# Patient Record
Sex: Male | Born: 1970 | Race: White | Hispanic: No | Marital: Married | Smoking: Never smoker
Health system: Southern US, Community
[De-identification: ages and names within clinical notes are randomized; demographics above are authoritative.]

## PROBLEM LIST (undated history)

## (undated) DIAGNOSIS — K219 Gastro-esophageal reflux disease without esophagitis: Secondary | ICD-10-CM

## (undated) DIAGNOSIS — I10 Essential (primary) hypertension: Secondary | ICD-10-CM

## (undated) HISTORY — PX: SALIVARY GLAND SURGERY: SHX768

## (undated) HISTORY — PX: CHOLECYSTECTOMY: SHX55

## (undated) HISTORY — DX: Gastro-esophageal reflux disease without esophagitis: K21.9

---

## 1998-11-30 ENCOUNTER — Emergency Department (HOSPITAL_COMMUNITY): Admission: EM | Admit: 1998-11-30 | Discharge: 1998-11-30 | Payer: Self-pay | Admitting: Emergency Medicine

## 1998-12-29 ENCOUNTER — Ambulatory Visit (HOSPITAL_COMMUNITY): Admission: RE | Admit: 1998-12-29 | Discharge: 1998-12-29 | Payer: Self-pay | Admitting: Specialist

## 1998-12-29 ENCOUNTER — Encounter: Payer: Self-pay | Admitting: Specialist

## 1999-05-22 ENCOUNTER — Emergency Department (HOSPITAL_COMMUNITY): Admission: EM | Admit: 1999-05-22 | Discharge: 1999-05-22 | Payer: Self-pay | Admitting: Emergency Medicine

## 1999-05-22 ENCOUNTER — Encounter: Payer: Self-pay | Admitting: Emergency Medicine

## 2000-07-29 HISTORY — PX: FOOT SURGERY: SHX648

## 2000-08-08 ENCOUNTER — Encounter: Payer: Self-pay | Admitting: Emergency Medicine

## 2000-08-08 ENCOUNTER — Emergency Department (HOSPITAL_COMMUNITY): Admission: EM | Admit: 2000-08-08 | Discharge: 2000-08-08 | Payer: Self-pay | Admitting: Emergency Medicine

## 2001-09-28 ENCOUNTER — Emergency Department (HOSPITAL_COMMUNITY): Admission: EM | Admit: 2001-09-28 | Discharge: 2001-09-29 | Payer: Self-pay | Admitting: Emergency Medicine

## 2001-12-11 ENCOUNTER — Emergency Department (HOSPITAL_COMMUNITY): Admission: EM | Admit: 2001-12-11 | Discharge: 2001-12-11 | Payer: Self-pay | Admitting: *Deleted

## 2001-12-11 ENCOUNTER — Encounter: Payer: Self-pay | Admitting: Emergency Medicine

## 2002-02-01 ENCOUNTER — Encounter: Admission: RE | Admit: 2002-02-01 | Discharge: 2002-03-11 | Payer: Self-pay | Admitting: Orthopedic Surgery

## 2002-07-22 ENCOUNTER — Emergency Department (HOSPITAL_COMMUNITY): Admission: EM | Admit: 2002-07-22 | Discharge: 2002-07-22 | Payer: Self-pay | Admitting: *Deleted

## 2009-07-12 ENCOUNTER — Emergency Department (HOSPITAL_COMMUNITY): Admission: EM | Admit: 2009-07-12 | Discharge: 2009-07-12 | Payer: Self-pay | Admitting: Emergency Medicine

## 2010-10-30 LAB — URINALYSIS, ROUTINE W REFLEX MICROSCOPIC
Bilirubin Urine: NEGATIVE
Glucose, UA: NEGATIVE mg/dL
Hgb urine dipstick: NEGATIVE
Ketones, ur: NEGATIVE mg/dL
Nitrite: NEGATIVE
Protein, ur: NEGATIVE mg/dL
Specific Gravity, Urine: 1.015 (ref 1.005–1.030)
Urobilinogen, UA: 0.2 mg/dL (ref 0.0–1.0)
pH: 7 (ref 5.0–8.0)

## 2010-10-30 LAB — CBC
MCHC: 34.4 g/dL (ref 30.0–36.0)
RBC: 5.37 MIL/uL (ref 4.22–5.81)
WBC: 10.4 10*3/uL (ref 4.0–10.5)

## 2010-10-30 LAB — DIFFERENTIAL
Basophils Relative: 1 % (ref 0–1)
Lymphocytes Relative: 24 % (ref 12–46)
Lymphs Abs: 2.5 10*3/uL (ref 0.7–4.0)
Monocytes Relative: 8 % (ref 3–12)
Neutro Abs: 6.6 10*3/uL (ref 1.7–7.7)
Neutrophils Relative %: 63 % (ref 43–77)

## 2010-10-30 LAB — BASIC METABOLIC PANEL
Calcium: 9 mg/dL (ref 8.4–10.5)
Creatinine, Ser: 0.88 mg/dL (ref 0.4–1.5)
GFR calc Af Amer: >60 mL/min (ref >60)
GFR calc non Af Amer: >60 mL/min (ref >60)

## 2011-09-10 ENCOUNTER — Emergency Department (HOSPITAL_COMMUNITY)
Admission: EM | Admit: 2011-09-10 | Discharge: 2011-09-10 | Disposition: A | Discharge status: Home / Self Care | Payer: 59 | Attending: Emergency Medicine | Admitting: Emergency Medicine

## 2011-09-10 ENCOUNTER — Emergency Department (HOSPITAL_COMMUNITY): Payer: 59

## 2011-09-10 ENCOUNTER — Encounter (HOSPITAL_COMMUNITY): Payer: Self-pay

## 2011-09-10 DIAGNOSIS — R221 Localized swelling, mass and lump, neck: Secondary | ICD-10-CM | POA: Insufficient documentation

## 2011-09-10 DIAGNOSIS — R220 Localized swelling, mass and lump, head: Secondary | ICD-10-CM | POA: Insufficient documentation

## 2011-09-10 DIAGNOSIS — M549 Dorsalgia, unspecified: Secondary | ICD-10-CM | POA: Insufficient documentation

## 2011-09-10 DIAGNOSIS — R6884 Jaw pain: Secondary | ICD-10-CM | POA: Insufficient documentation

## 2011-09-10 DIAGNOSIS — Z79899 Other long term (current) drug therapy: Secondary | ICD-10-CM | POA: Insufficient documentation

## 2011-09-10 DIAGNOSIS — C07 Malignant neoplasm of parotid gland: Secondary | ICD-10-CM

## 2011-09-10 DIAGNOSIS — R55 Syncope and collapse: Secondary | ICD-10-CM | POA: Insufficient documentation

## 2011-09-10 DIAGNOSIS — I10 Essential (primary) hypertension: Secondary | ICD-10-CM | POA: Insufficient documentation

## 2011-09-10 DIAGNOSIS — R234 Changes in skin texture: Secondary | ICD-10-CM | POA: Insufficient documentation

## 2011-09-10 HISTORY — DX: Essential (primary) hypertension: I10

## 2011-09-10 LAB — BASIC METABOLIC PANEL
BUN: 11 mg/dL (ref 6–23)
Chloride: 101 mEq/L (ref 96–112)
Glucose, Bld: 84 mg/dL (ref 70–99)
Potassium: 3.4 mEq/L — ABNORMAL LOW (ref 3.5–5.1)

## 2011-09-10 LAB — DIFFERENTIAL
Eosinophils Absolute: 0.3 10*3/uL (ref 0.0–0.7)
Lymphs Abs: 2.4 10*3/uL (ref 0.7–4.0)
Monocytes Relative: 7 % (ref 3–12)
Neutrophils Relative %: 71 % (ref 43–77)

## 2011-09-10 LAB — CBC
Hemoglobin: 16.2 g/dL (ref 13.0–17.0)
MCH: 29 pg (ref 26.0–34.0)
RBC: 5.59 MIL/uL (ref 4.22–5.81)

## 2011-09-10 MED ORDER — OXYCODONE-ACETAMINOPHEN 5-325 MG PO TABS
1.0000 | ORAL_TABLET | Freq: Once | ORAL | Status: AC
  Administered 2011-09-10: 1 via ORAL
  Filled 2011-09-10: qty 1

## 2011-09-10 MED ORDER — SODIUM CHLORIDE 0.9 % IV SOLN
INTRAVENOUS | Status: DC
  Administered 2011-09-10: 15:00:00 via INTRAVENOUS

## 2011-09-10 MED ORDER — MORPHINE SULFATE 4 MG/ML IJ SOLN
4.0000 mg | Freq: Once | INTRAMUSCULAR | Status: AC
  Administered 2011-09-10: 4 mg via INTRAVENOUS
  Filled 2011-09-10: qty 1

## 2011-09-10 MED ORDER — OXYCODONE-ACETAMINOPHEN 5-325 MG PO TABS: 2.0000 | ORAL_TABLET | ORAL | Status: AC | PRN

## 2011-09-10 MED ORDER — IOHEXOL 300 MG/ML  SOLN
75.0000 mL | Freq: Once | INTRAMUSCULAR | Status: AC | PRN
  Administered 2011-09-10: 75 mL via INTRAVENOUS

## 2011-09-10 NOTE — ED Provider Notes (Signed)
History     CSN: [id number]  Arrival date & time 09/10/11  1137   First MD Initiated Contact with Patient 09/10/11 1256      Chief Complaint  Patient presents with  . Fall  . Near Syncope    (Consider location/radiation/quality/duration/timing/severity/associated sxs/prior treatment) Patient is a 41 y.o. male presenting with fall. The history is provided by the patient and medical records.  Fall Pertinent negatives include no abdominal pain and no headaches.   the patient is a 41 year old, male, who complains of left sided jaw pain and swelling since.  He fell 3 days ago.  He states that he and his wife had gone out to a restaurant to eat.  Afterwards, he developed excruciating abdominal pain.  He went to the bathroom and passed out.  He struck the side of his face.  He has had pain and swelling on the left side of his jaw.  Since then.  His abdominal pain is resolved.  He went to see his primary care doctor.  They did plain x-rays, but were not able to perform a Panorex, so they sent him over here for further x-rays to see if he had a mandibular fracture.  An EKG was also performed at the primary care physician's hospital that did not show any signs of ischemia or irregular heartbeat.  Past Medical History  Diagnosis Date  . Hypertension     History reviewed. No pertinent past surgical history.  History reviewed. No pertinent family history.  History  Substance Use Topics  . Smoking status: Never Smoker   . Smokeless tobacco: Not on file  . Alcohol Use: No      Review of Systems  HENT: Negative for neck pain.   Gastrointestinal: Negative for abdominal pain.  Musculoskeletal: Positive for back pain.       Left mandibular pain and swelling  Neurological: Negative for weakness and headaches.  Hematological: Does not bruise/bleed easily.  Psychiatric/Behavioral: Negative for confusion.  All other systems reviewed and are negative.    Allergies  Allegra  Home  Medications   Current Outpatient Rx  Name Route Sig Dispense Refill  . HYDROCHLOROTHIAZIDE 25 MG PO TABS Oral Take 25 mg by mouth daily.    . TRAMADOL HCL 50 MG PO TABS Oral Take 50 mg by mouth every 6 (six) hours as needed. For pain      BP 174/112  Pulse 75  Temp(Src) 97.9 F (36.6 C) (Oral)  Resp 20  SpO2 95%  Physical Exam  Vitals reviewed. Constitutional: He is oriented to person, place, and time. He appears well-developed and well-nourished.  HENT:  Head: Normocephalic.  Eyes: Conjunctivae are normal. Pupils are equal, round, and reactive to light.  Neck: Normal range of motion.  Cardiovascular: Normal rate.   No murmur heard. Pulmonary/Chest: Effort normal. No respiratory distress.  Abdominal: Soft. He exhibits no distension. There is no tenderness.  Musculoskeletal: Normal range of motion.       Patient has obvious swelling at the angle of the mandible on the left-hand side.  No overlying ecchymoses.  There is tenderness at the angle of the mandible on the left side.  His teeth are properly aligned.  There is no other tenderness in the mandible over the maxilla and there is no other evidence of head injury.  The swollen area is approx. 4 cm indurated and ttp  No thoracic or lumbar spine tenderness  Neurological: He is alert and oriented to person, place, and time.  Skin: Skin is warm and dry.  Psychiatric: He has a normal mood and affect.    ED Course  Procedures (including critical care time) Fall 3 days ago due to what sounds like a vasovagal reaction from severe abdominal pain.  EKG does not show any signs of dysrhythmias.  Left mandibular pain, swelling, and tenderness.  Give him an analgesic, and perform a Panorex of his jaw, to look for fracture.  He also has significant hypertension, so we will do blood tests, to look for any signs of renal insufficiency    Labs Reviewed  CBC  DIFFERENTIAL  BASIC METABOLIC PANEL   No results found.   No diagnosis  found.  Pain improved.  Discussed result of test and other possibilities eg lymph node, parotid.   Suggested ct for better eval. He agrees.   Called radiologist. He suggests ct neck with contrast.  I spoke with Dr. Jearld Fenton, ENT. He says the CT result is the type of problem he takes care of. He says I can refer the pt to his office for definitive care.   I explained findings to pt and wife. They understand and will schedule appt with dr. Jearld Fenton.  MDM  Left parotid gland ca.        Nicholes Stairs, MD 09/10/11 [phone]

## 2011-09-10 NOTE — ED Notes (Signed)
[facility], DR. Hall, referred pt to ER. Pt states that he passed you on Saturday. Pt states that he hit his head and jaw when he fell. Pt noted to have swelling in left jaw. Neuro intact. No noted weakness in extremities.

## 2011-09-10 NOTE — ED Notes (Signed)
Pt states that he is having stomach cramps now. States that this started before his last  episode

## 2011-09-10 NOTE — ED Notes (Signed)
Sent from family md for swelling to right jaw due to fall this weekend and had elevated bp at md office.

## 2011-09-10 NOTE — Discharge Instructions (Signed)
Your x-ray does not show any broken bones.  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  Use Percocet for pain.  Call to schedule an appointment with Dr. Jearld Fenton tomorrow.  Return for worse or uncontrolled symptoms

## 2011-09-10 NOTE — ED Notes (Signed)
Pt transported to and from CT scanner via wheelchair and tolerated well.  Pt reports no relief from headache.

## 2011-12-03 ENCOUNTER — Other Ambulatory Visit: Payer: Self-pay | Admitting: Specialist

## 2011-12-03 DIAGNOSIS — M542 Cervicalgia: Secondary | ICD-10-CM

## 2011-12-04 ENCOUNTER — Ambulatory Visit
Admission: RE | Admit: 2011-12-04 | Discharge: 2011-12-04 | Disposition: A | Discharge status: Home / Self Care | Payer: 59 | Source: Ambulatory Visit | Attending: Specialist | Admitting: Specialist

## 2011-12-04 ENCOUNTER — Other Ambulatory Visit (HOSPITAL_COMMUNITY): Payer: Self-pay | Admitting: Family Medicine

## 2011-12-04 DIAGNOSIS — R55 Syncope and collapse: Secondary | ICD-10-CM

## 2011-12-04 DIAGNOSIS — I10 Essential (primary) hypertension: Secondary | ICD-10-CM

## 2011-12-04 DIAGNOSIS — M542 Cervicalgia: Secondary | ICD-10-CM

## 2011-12-05 ENCOUNTER — Encounter (HOSPITAL_COMMUNITY): Payer: 59

## 2011-12-11 ENCOUNTER — Ambulatory Visit (HOSPITAL_COMMUNITY): Payer: 59 | Attending: Cardiology | Admitting: Radiology

## 2011-12-11 VITALS — BP 119/92 | HR 72 | Ht 70.0 in | Wt 258.0 lb

## 2011-12-11 DIAGNOSIS — R42 Dizziness and giddiness: Secondary | ICD-10-CM | POA: Insufficient documentation

## 2011-12-11 DIAGNOSIS — I10 Essential (primary) hypertension: Secondary | ICD-10-CM | POA: Insufficient documentation

## 2011-12-11 DIAGNOSIS — R0989 Other specified symptoms and signs involving the circulatory and respiratory systems: Secondary | ICD-10-CM | POA: Insufficient documentation

## 2011-12-11 DIAGNOSIS — R0602 Shortness of breath: Secondary | ICD-10-CM

## 2011-12-11 DIAGNOSIS — R55 Syncope and collapse: Secondary | ICD-10-CM | POA: Insufficient documentation

## 2011-12-11 DIAGNOSIS — E785 Hyperlipidemia, unspecified: Secondary | ICD-10-CM | POA: Insufficient documentation

## 2011-12-11 DIAGNOSIS — E669 Obesity, unspecified: Secondary | ICD-10-CM | POA: Insufficient documentation

## 2011-12-11 DIAGNOSIS — R0609 Other forms of dyspnea: Secondary | ICD-10-CM | POA: Insufficient documentation

## 2011-12-11 MED ORDER — TECHNETIUM TC 99M TETROFOSMIN IV KIT
33.0000 | PACK | Freq: Once | INTRAVENOUS | Status: AC | PRN
  Administered 2011-12-11: 33 via INTRAVENOUS

## 2011-12-11 MED ORDER — TECHNETIUM TC 99M TETROFOSMIN IV KIT
10.1000 | PACK | Freq: Once | INTRAVENOUS | Status: AC | PRN
  Administered 2011-12-11: 10.1 via INTRAVENOUS

## 2011-12-11 NOTE — Progress Notes (Signed)
[facility] SITE 3 NUCLEAR MED [address] [place] [address] [phone]  Cardiology Nuclear Med Study  Todd Peters is a 41 y.o. male     MRN : [id number]     DOB: 1970/08/27  Procedure Date: 12/11/2011  Nuclear Med Background Indication for Stress Test:  Evaluation for Ischemia History:  No previously documented CAD Cardiac Risk Factors: Hypertension, Lipids and Obesity  Symptoms:  DOE, Light-Headedness and Syncope x 2 in the past 28-months   Nuclear Pre-Procedure Caffeine/Decaff Intake:  None NPO After: 7:30pm   Lungs:  Clear. IV 0.9% NS with Angio Cath:  20g  IV Site: R Antecubital  IV Started by:  Stanton Kidney, EMT-P  Chest Size (in):  48 Cup Size: n/a  Height: 5\' 10"  (1.778 m)  Weight:  258 lb (117.028 kg)  BMI:  Body mass index is 37.02 kg/(m^2). Tech Comments:  No a.m. Medications taken today.    Nuclear Med Study 1 or 2 day study: 1 day  Stress Test Type:  Stress  Reading MD: Willa Rough, MD  Order Authorizing Provider:  Rudi Heap, MD  Resting Radionuclide: Technetium 30m Tetrofosmin  Resting Radionuclide Dose: 10.1 mCi   Stress Radionuclide:  Technetium 13m Tetrofosmin  Stress Radionuclide Dose: 32.8 mCi           Stress Protocol Rest HR: 72 Stress HR: 155  Rest BP: 119/92 Stress BP: 181/113 and 189/106  Exercise Time (min): 5:01 METS: 7.0   Predicted Max HR: 181 bpm % Max HR: 85.64 bpm Rate Pressure Product: 21308   Dose of Adenosine (mg):  n/a Dose of Lexiscan: n/a mg  Dose of Atropine (mg): n/a Dose of Dobutamine: n/a mcg/kg/min (at max HR)  Stress Test Technologist: Smiley Houseman, CMA-N  Nuclear Technologist:  Domenic Polite, CNMT     Rest Procedure:  Myocardial perfusion imaging was performed at rest 45 minutes following the intravenous administration of Technetium 5m Tetrofosmin.  Rest MVH:QIONG  Stress Procedure:  The patient exercised on the treadmill utilizing the Bruce protocol for 5:01 minutes. He then stopped  due to fatigue.  He did c/o chest tightness, 2-3/10, with exercise.  There were no diagnostic ST-T wave changes.  He did have a hypertensive response to exercise while being off his medication, 181/113.  His O2 SAT was 90% at peak exercise. Technetium 72m Tetrofosmin was injected at peak exercise and myocardial perfusion imaging was performed after a brief delay.  Stress ECG: No significant change from baseline ECG  QPS Raw Data Images:  Patient motion noted; appropriate software correction applied. Stress Images:  Normal homogeneous uptake in all areas of the myocardium. Rest Images:  Normal homogeneous uptake in all areas of the myocardium. Subtraction (SDS):  No evidence of ischemia. Transient Ischemic Dilatation (Normal <1.22):  0.81 Lung/Heart Ratio (Normal <0.45):  0.37  Quantitative Gated Spect Images QGS EDV:  97 ml QGS ESV:  40 ml  Impression Exercise Capacity:  Poor exercise capacity. BP Response:  Patient hypertensive at rest off his meds. Clinical Symptoms:  SOB ECG Impression:  No significant ST segment change suggestive of ischemia. Comparison with Prior Nuclear Study: No previous nuclear study performed  Overall Impression:  Normal stress nuclear study. Poor exercise tolerance  LV Ejection Fraction: 59%.  LV Wall Motion:  Normal Wall Motion  Willa Rough, MD

## 2012-11-11 ENCOUNTER — Telehealth: Payer: Self-pay | Admitting: Nurse Practitioner

## 2012-11-11 ENCOUNTER — Ambulatory Visit (INDEPENDENT_AMBULATORY_CARE_PROVIDER_SITE_OTHER): Payer: 59 | Admitting: General Practice

## 2012-11-11 VITALS — BP 174/115 | HR 78 | Temp 98.8°F | Ht 70.0 in | Wt 272.0 lb

## 2012-11-11 DIAGNOSIS — I10 Essential (primary) hypertension: Secondary | ICD-10-CM

## 2012-11-11 DIAGNOSIS — R631 Polydipsia: Secondary | ICD-10-CM

## 2012-11-11 DIAGNOSIS — R350 Frequency of micturition: Secondary | ICD-10-CM

## 2012-11-11 LAB — POCT URINALYSIS DIPSTICK
Bilirubin, UA: NEGATIVE
Blood, UA: NEGATIVE
Glucose, UA: NEGATIVE
Ketones, UA: NEGATIVE
Spec Grav, UA: 1.015

## 2012-11-11 MED ORDER — LISINOPRIL-HYDROCHLOROTHIAZIDE 20-25 MG PO TABS: 1.0000 | ORAL_TABLET | Freq: Every day | ORAL | Status: DC

## 2012-11-11 NOTE — Progress Notes (Signed)
  Subjective:    Patient ID: Todd Peters, male    DOB: 03-15-1971, 41 y.o.   MRN: [id number]  HPI Presents today with increased frequency of urination, increased thirst, and hunger. Reports working 60 plus hours a week. Reports drinking 2-3 bottles of water (20 ounce bottles0 during his work shift and another 2 once home. Prior to the last week he was drink sprite (3-4 bottles at work and more at home). Denies being a known diabetic. Reports last dose of blood pressure medication taken (lisinopril/HCTZ 20/25) was in January 2014, due to lack of insurance.     Review of Systems  Constitutional: Negative for fever and chills.  Respiratory: Negative for shortness of breath.   Cardiovascular: Negative for chest pain.  Genitourinary: Positive for urgency and frequency. Negative for dysuria and difficulty urinating.  Neurological: Negative for dizziness and headaches.  Psychiatric/Behavioral: Negative.        Objective:   Physical Exam  Constitutional: He is oriented to person, place, and time. He appears well-developed and well-nourished.  Cardiovascular: Normal rate, regular rhythm and normal heart sounds.   No murmur heard. Pulmonary/Chest: Effort normal and breath sounds normal. No respiratory distress. He exhibits no tenderness.  Abdominal: Soft. Bowel sounds are normal. He exhibits no distension. There is no tenderness. There is no rebound and no guarding.  Neurological: He is alert and oriented to person, place, and time.  Skin: Skin is warm and dry.  Psychiatric: He has a normal mood and affect.   Results for orders placed in visit on 11/11/12  POCT URINALYSIS DIPSTICK      Result Value Range   Color, UA yellow     Clarity, UA cloudy     Glucose, UA negative     Bilirubin, UA negative     Ketones, UA negative     Spec Grav, UA 1.015     Blood, UA negative     pH, UA 7.5     Protein, UA negative     Urobilinogen, UA negative     Nitrite, UA negative     Leukocytes, UA  Negative    GLUCOSE, POCT (MANUAL RESULT ENTRY)      Result Value Range   POC Glucose 94  70 - 99 mg/dl          Assessment & Plan:  Take blood pressure medication as prescribed (lisinopril/hctz 20/25) Monitor blood pressure daily Low sodium diet and exercise F/U in one week for blood pressure check Patient verbalized understanding RTO if symptoms worsen or call 911  Raymon Mutton, FNP-C

## 2012-11-11 NOTE — Patient Instructions (Signed)

## 2012-11-11 NOTE — Telephone Encounter (Signed)
APPT GIVEN

## 2012-11-18 ENCOUNTER — Ambulatory Visit (INDEPENDENT_AMBULATORY_CARE_PROVIDER_SITE_OTHER): Payer: 59 | Admitting: Family Medicine

## 2012-11-18 VITALS — BP 130/85 | HR 93

## 2012-11-18 DIAGNOSIS — I10 Essential (primary) hypertension: Secondary | ICD-10-CM

## 2012-11-18 NOTE — Progress Notes (Signed)
Patient ID: Todd Peters, male   DOB: 1971-05-20, 42 y.o.   MRN: [id number] PT CAME IN TODAY FOR BP CK PER MAE MARTIN- SEE VS FOR #'S.

## 2012-12-11 ENCOUNTER — Encounter: Payer: Self-pay | Admitting: General Practice

## 2012-12-11 ENCOUNTER — Ambulatory Visit (INDEPENDENT_AMBULATORY_CARE_PROVIDER_SITE_OTHER): Payer: 59 | Admitting: General Practice

## 2012-12-11 VITALS — BP 130/80 | HR 111 | Temp 98.7°F | Ht 68.0 in | Wt 270.0 lb

## 2012-12-11 DIAGNOSIS — I10 Essential (primary) hypertension: Secondary | ICD-10-CM

## 2012-12-11 MED ORDER — LISINOPRIL-HYDROCHLOROTHIAZIDE 20-25 MG PO TABS: 1.0000 | ORAL_TABLET | Freq: Every day | ORAL | Status: DC

## 2012-12-11 NOTE — Progress Notes (Signed)
  Subjective:    Patient ID: Todd Peters, male    DOB: [birth year], 42 y.o.   MRN: [id number]  HPI Presents today to have blood pressure rechecked. Patient reports taking medication as prescribed. Denies headache, dizziness, blurred vision, or episodes of syncope.     Review of Systems  Constitutional: Negative for fever and chills.  Respiratory: Negative for chest tightness and shortness of breath.   Cardiovascular: Negative for chest pain.  Genitourinary: Negative for difficulty urinating.  Neurological: Negative for dizziness, syncope, weakness and headaches.  Psychiatric/Behavioral: Negative.        Objective:   Physical Exam  Constitutional: He is oriented to person, place, and time. He appears well-developed and well-nourished.  Cardiovascular: Normal rate, regular rhythm and normal heart sounds.   Pulmonary/Chest: Effort normal and breath sounds normal.  Neurological: He is alert and oriented to person, place, and time.  Skin: Skin is warm and dry.  Psychiatric: He has a normal mood and affect.          Assessment & Plan:  1. Essential hypertension, benign - lisinopril-hydrochlorothiazide (PRINZIDE,ZESTORETIC) 20-25 MG per tablet; Take 1 tablet by mouth daily.  Dispense: 30 tablet; Refill: 3 Continue medications as prescribed Continue to work on [employer] (low salt, low fat, increase fiber) Continue to work on regular exercise as tolerated Follow up visit in 3 months or sooner if symptoms develop Monitor blood pressure at least 2 weekly and create a diary Patient verbalized understanding Coralie Keens, FNP-C

## 2012-12-11 NOTE — Patient Instructions (Signed)

## 2013-03-15 ENCOUNTER — Ambulatory Visit: Payer: 59 | Admitting: General Practice

## 2013-03-30 ENCOUNTER — Ambulatory Visit (INDEPENDENT_AMBULATORY_CARE_PROVIDER_SITE_OTHER): Payer: 59 | Admitting: General Practice

## 2013-03-30 ENCOUNTER — Encounter: Payer: Self-pay | Admitting: General Practice

## 2013-03-30 VITALS — BP 129/79 | HR 95 | Temp 98.9°F | Ht 69.0 in | Wt 274.0 lb

## 2013-03-30 DIAGNOSIS — I10 Essential (primary) hypertension: Secondary | ICD-10-CM

## 2013-03-30 MED ORDER — LISINOPRIL-HYDROCHLOROTHIAZIDE 20-25 MG PO TABS: 1.0000 | ORAL_TABLET | Freq: Every day | ORAL | Status: DC

## 2013-03-30 NOTE — Progress Notes (Signed)
  Subjective:    Patient ID: Todd Peters, male    DOB: 03-06-1971, 41 y.o.   MRN: [id number]  HPI Patient presents today for follow up of chronic condition of hypertension. He denies taking blood pressure medication this am. He reports blood pressures when taking at home range 120's-130's/70's. Reports taking medications as directed.     Review of Systems  Constitutional: Negative for fever and chills.  HENT: Negative for neck pain and neck stiffness.   Respiratory: Negative for chest tightness and shortness of breath.   Cardiovascular: Negative for chest pain.  Gastrointestinal: Negative for nausea, vomiting and abdominal pain.  Genitourinary: Negative for difficulty urinating.  Neurological: Negative for dizziness, weakness and headaches.       Objective:   Physical Exam  Constitutional: He is oriented to person, place, and time. He appears well-developed and well-nourished.  HENT:  Head: Normocephalic and atraumatic.  Right Ear: External ear normal.  Left Ear: External ear normal.  Nose: Nose normal.  Mouth/Throat: Oropharynx is clear and moist.  Eyes: Conjunctivae and EOM are normal. Pupils are equal, round, and reactive to light.  Neck: Normal range of motion. Neck supple. No thyromegaly present.  Cardiovascular: Normal rate, regular rhythm and normal heart sounds.   Pulmonary/Chest: Effort normal and breath sounds normal. No respiratory distress. He exhibits no tenderness.  Abdominal: Soft. Bowel sounds are normal. He exhibits no distension. There is no tenderness.  Lymphadenopathy:    He has no cervical adenopathy.  Neurological: He is alert and oriented to person, place, and time.  Skin: Skin is warm and dry.  Psychiatric: He has a normal mood and affect.          Assessment & Plan:  1. Hypertension - lisinopril-hydrochlorothiazide (PRINZIDE,ZESTORETIC) 20-25 MG per tablet; Take 1 tablet by mouth daily.  Dispense: 30 tablet; Refill: 3 -Continue all current  medication F/u in 3 months Discussed healthy eating habits, regular exercise, and maintaining blood pressure diary Patient verbalized understanding Coralie Keens, FNP-C

## 2013-10-22 ENCOUNTER — Emergency Department (HOSPITAL_COMMUNITY): Payer: Managed Care, Other (non HMO)

## 2013-10-22 ENCOUNTER — Emergency Department (HOSPITAL_COMMUNITY)
Admission: EM | Admit: 2013-10-22 | Discharge: 2013-10-22 | Disposition: A | Discharge status: Home / Self Care | Payer: Managed Care, Other (non HMO) | Attending: Emergency Medicine | Admitting: Emergency Medicine

## 2013-10-22 ENCOUNTER — Ambulatory Visit (INDEPENDENT_AMBULATORY_CARE_PROVIDER_SITE_OTHER): Payer: 59 | Admitting: Family Medicine

## 2013-10-22 VITALS — BP 116/81 | HR 74 | Temp 98.7°F | Ht 68.0 in | Wt 263.8 lb

## 2013-10-22 DIAGNOSIS — Z0000 Encounter for general adult medical examination without abnormal findings: Secondary | ICD-10-CM

## 2013-10-22 DIAGNOSIS — F411 Generalized anxiety disorder: Secondary | ICD-10-CM | POA: Insufficient documentation

## 2013-10-22 DIAGNOSIS — R55 Syncope and collapse: Secondary | ICD-10-CM

## 2013-10-22 DIAGNOSIS — I10 Essential (primary) hypertension: Secondary | ICD-10-CM

## 2013-10-22 DIAGNOSIS — Z79899 Other long term (current) drug therapy: Secondary | ICD-10-CM | POA: Insufficient documentation

## 2013-10-22 DIAGNOSIS — J111 Influenza due to unidentified influenza virus with other respiratory manifestations: Secondary | ICD-10-CM

## 2013-10-22 DIAGNOSIS — R197 Diarrhea, unspecified: Secondary | ICD-10-CM

## 2013-10-22 LAB — COMPREHENSIVE METABOLIC PANEL
ALBUMIN: 3.6 g/dL (ref 3.5–5.2)
ALT: 32 U/L (ref 0–53)
AST: 24 U/L (ref 0–37)
Alkaline Phosphatase: 111 U/L (ref 39–117)
BUN: 12 mg/dL (ref 6–23)
CALCIUM: 9.2 mg/dL (ref 8.4–10.5)
CO2: 26 mEq/L (ref 19–32)
Chloride: 103 mEq/L (ref 96–112)
Creatinine, Ser: 0.95 mg/dL (ref 0.50–1.35)
GFR calc non Af Amer: >90 mL/min (ref >90)
Glucose, Bld: 95 mg/dL (ref 70–99)
Potassium: 4 mEq/L (ref 3.7–5.3)
SODIUM: 141 meq/L (ref 137–147)
TOTAL PROTEIN: 6.8 g/dL (ref 6.0–8.3)
Total Bilirubin: 0.5 mg/dL (ref 0.3–1.2)

## 2013-10-22 LAB — CBC WITH DIFFERENTIAL/PLATELET
BASOS ABS: 0 10*3/uL (ref 0.0–0.1)
BASOS PCT: 0 % (ref 0–1)
EOS ABS: 0.2 10*3/uL (ref 0.0–0.7)
EOS PCT: 1 % (ref 0–5)
HCT: 43.4 % (ref 39.0–52.0)
Hemoglobin: 15.7 g/dL (ref 13.0–17.0)
LYMPHS ABS: 1.5 10*3/uL (ref 0.7–4.0)
Lymphocytes Relative: 12 % (ref 12–46)
MCH: 29.7 pg (ref 26.0–34.0)
MCHC: 36.2 g/dL — AB (ref 30.0–36.0)
MCV: 82 fL (ref 78.0–100.0)
Monocytes Absolute: 0.7 10*3/uL (ref 0.1–1.0)
Monocytes Relative: 6 % (ref 3–12)
Neutro Abs: 9.8 10*3/uL — ABNORMAL HIGH (ref 1.7–7.7)
Neutrophils Relative %: 81 % — ABNORMAL HIGH (ref 43–77)
PLATELETS: 156 10*3/uL (ref 150–400)
RBC: 5.29 MIL/uL (ref 4.22–5.81)
RDW: 12.8 % (ref 11.5–15.5)
WBC: 12.1 10*3/uL — ABNORMAL HIGH (ref 4.0–10.5)

## 2013-10-22 LAB — I-STAT TROPONIN, ED: Troponin i, poc: 0 ng/mL (ref 0.00–0.08)

## 2013-10-22 MED ORDER — SODIUM CHLORIDE 0.9 % IV BOLUS (SEPSIS)
1000.0000 mL | Freq: Once | INTRAVENOUS | Status: AC
  Administered 2013-10-22: 1000 mL via INTRAVENOUS

## 2013-10-22 MED ORDER — SERTRALINE HCL 50 MG PO TABS: 50.0000 mg | ORAL_TABLET | Freq: Every day | ORAL | Status: AC

## 2013-10-22 MED ORDER — OSELTAMIVIR PHOSPHATE 75 MG PO CAPS: 75.0000 mg | ORAL_CAPSULE | Freq: Two times a day (BID) | ORAL | Status: DC

## 2013-10-22 MED ORDER — LISINOPRIL-HYDROCHLOROTHIAZIDE 20-25 MG PO TABS: 1.0000 | ORAL_TABLET | Freq: Every day | ORAL | Status: AC

## 2013-10-22 MED ORDER — CHOLESTYRAMINE LIGHT 4 G PO PACK: 4.0000 g | PACK | Freq: Two times a day (BID) | ORAL | Status: DC

## 2013-10-22 NOTE — Discharge Instructions (Signed)
Vasovagal Syncope, Adult  Syncope, commonly known as fainting, is a temporary loss of consciousness. It occurs when the blood flow to the brain is reduced. Vasovagal syncope (also called neurocardiogenic syncope) is a fainting spell in which the blood flow to the brain is reduced because of a sudden drop in heart rate and blood pressure. Vasovagal syncope occurs when the brain and the cardiovascular system (blood vessels) do not adequately communicate and respond to each other. This is the most common cause of fainting. It often occurs in response to fear or some other type of emotional or physical stress. The body has a reaction in which the heart starts beating too slowly or the blood vessels expand, reducing blood pressure. This type of fainting spell is generally considered harmless. However, injuries can occur if a person takes a sudden fall during a fainting spell.   CAUSES   Vasovagal syncope occurs when a person's blood pressure and heart rate decrease suddenly, usually in response to a trigger. Many things and situations can trigger an episode. Some of these include:   · Pain.    · Fear.    · The sight of blood or medical procedures, such as blood being drawn from a vein.    · Common activities, such as coughing, swallowing, stretching, or going to the bathroom.    · Emotional stress.    · Prolonged standing, especially in a warm environment.    · Lack of sleep or rest.    · Prolonged lack of food.    · Prolonged lack of fluids.    · Recent illness.  · The use of certain drugs that affect blood pressure, such as cocaine, alcohol, marijuana, inhalants, and opiates.    SYMPTOMS   Before the fainting episode, you may:   · Feel dizzy or light headed.    · Become pale.  · Sense that you are going to faint.    · Feel like the room is spinning.    · Have tunnel vision, only seeing directly in front of you.    · Feel sick to your stomach (nauseous).    · See spots or slowly lose vision.    · Hear ringing in your  ears.    · Have a headache.    · Feel warm and sweaty.    · Feel a sensation of pins and needles.  During the fainting spell, you will generally be unconscious for no longer than a couple minutes before waking up and returning to normal. If you get up too quickly before your body can recover, you may faint again. Some twitching or jerky movements may occur during the fainting spell.   DIAGNOSIS   Your caregiver will ask about your symptoms, take a medical history, and perform a physical exam. Various tests may be done to rule out other causes of fainting. These may include blood tests and tests to check the heart, such as electrocardiography, echocardiography, and possibly an electrophysiology study. When other causes have been ruled out, a test may be done to check the body's response to changes in position (tilt table test).  TREATMENT   Most cases of vasovagal syncope do not require treatment. Your caregiver may recommend ways to avoid fainting triggers and may provide home strategies for preventing fainting. If you must be exposed to a possible trigger, you can drink additional fluids to help reduce your chances of having an episode of vasovagal syncope. If you have warning signs of an oncoming episode, you can respond by positioning yourself favorably (lying down).  If your fainting spells continue, you may be   given medicines to prevent fainting. Some medicines may help make you more resistant to repeated episodes of vasovagal syncope. Special exercises or compression stockings may be recommended. In rare cases, the surgical placement of a pacemaker is considered.  HOME CARE INSTRUCTIONS   · Learn to identify the warning signs of vasovagal syncope.    · Sit or lie down at the first warning sign of a fainting spell. If sitting, put your head down between your legs. If you lie down, swing your legs up in the air to increase blood flow to the brain.    · Avoid hot tubs and saunas.  · Avoid prolonged  standing.  · Drink enough fluids to keep your urine clear or pale yellow. Avoid caffeine.  · Increase salt in your diet as directed by your caregiver.    · If you have to stand for a long time, perform movements such as:    · Crossing your legs.    · Flexing and stretching your leg muscles.    · Squatting.    · Moving your legs.    · Bending over.    · Only take over-the-counter or prescription medicines as directed by your caregiver. Do not suddenly stop any medicines without asking your caregiver first.   SEEK MEDICAL CARE IF:   · Your fainting spells continue or happen more frequently in spite of treatment.    · You lose consciousness for more than a couple minutes.  · You have fainting spells during or after exercising or after being startled.    · You have new symptoms that occur with the fainting spells, such as:    · Shortness of breath.  · Chest pain.    · Irregular heartbeat.    · You have episodes of twitching or jerky movements that last longer than a few seconds.  · You have episodes of twitching or jerky movements without obvious fainting.  SEEK IMMEDIATE MEDICAL CARE IF:   · You have injuries or bleeding after a fainting spell.    · You have episodes of twitching or jerky movements that last longer than 5 minutes.    · You have more than one spell of twitching or jerky movements before returning to consciousness after fainting.  MAKE SURE YOU:   · Understand these instructions.  · Will watch your condition.  · Will get help right away if you are not doing well or get worse.  Document Released: 07/01/2012 Document Reviewed: 07/01/2012  [employer]® Patient Information ©2014 [employer].

## 2013-10-22 NOTE — Progress Notes (Signed)
   Subjective:    Patient ID: Todd Peters, male    DOB: 24-Feb-1971, 43 y.o.   MRN: [id number]  HPI  This 43 y.o. male presents for evaluation of feeling weak and he has hx of syncopal episodes. He has been having episodes he state where he passes out and he collapses.  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 He c/o anxiety which does precipitate some of these episodes.  He states he has This at work sometimes.  He states over the last few days he has not been feeling well and feels Like he needs to take a day off.  He also c/o IBS and diarrhea which is constant.  He has difficulties With nausea and gets abdominal spasms which make him pass out as well.  His wife was dx With the flu today.  Review of Systems C/o fatigue and syncope No chest pain, SOB, HA, dizziness, vision change, N/V, diarrhea, constipation, dysuria, urinary urgency or frequency, myalgias, arthralgias or rash.     Objective:   Physical Exam  Vital signs noted  Well developed well nourished male.  HEENT - Head atraumatic Normocephalic                Eyes - PERRLA, Conjuctiva - clear Sclera- Clear EOMI                Ears - EAC's Wnl TM's Wnl Gross Hearing WNL                Throat - oropharanx wnl Respiratory - Lungs CTA bilateral Cardiac - RRR S1 and S2 without murmur  BP standing 120/80 pulse 72 GI - Abdomen soft Nontender and bowel sounds active x 4 Extremities - No edema. Neuro - Grossly intact.      Assessment & Plan:  Routine general medical examination at a health care facility - Plan: POCT rapid strep A, CBC with Differential, CMP14+EGFR, PSA, total and free, Thyroid Panel With TSH, CANCELED: POCT CBC  Anxiety state, unspecified - Plan: sertraline (ZOLOFT) 50 MG tablet  Syncope - Plan: Ambulatory referral to Cardiology  Diarrhea - Plan: cholestyramine light (PREVALITE) 4 G packet  Hypertension - Plan:  lisinopril-hydrochlorothiazide (PRINZIDE,ZESTORETIC) 20-25 MG per tablet.  Patient was lying recumbant while having blood drawn and states he is SOB and then becomes unresponsive, quits breathing, gets central cyanosis, fixed gaze, unresponsive to verbal and sternal rub and then arouses with trapezius squeeze and no bp or pulse radial is appreciated then He starts to breathe after approximately 30 seconds of apnea or loss of respirations and then He is very grey and pale and has diaphoresis, 911 is called oxygen applied, IV inserted, cardiac Monitor is showing SB rate in 50's w/o ectopy, bp is 80/palp right radial after several minutes And patient is confused for several minutes.  EMS arrives and patient becomes more awake And color returns after approximately 15 minutes.  Patient is taken to [facility] ED   Lysbeth Penner FNP

## 2013-10-22 NOTE — ED Provider Notes (Signed)
Medical screening examination/treatment/procedure(s) were performed by non-physician practitioner and as supervising physician I was immediately available for consultation/collaboration.   EKG Interpretation   Date/Time:  Friday October 22 2013 18:35:43 EDT Ventricular Rate:  62 PR Interval:  197 QRS Duration: 101 QT Interval:  419 QTC Calculation: 425 R Axis:   4 Text Interpretation:  Sinus rhythm Poor data quality wandering baseline in  II, III, aVF Confirmed by [facility]  MD, MICHEAL ([id number]) on 10/22/2013 7:08:22  PM        Saddie Benders. Dorna Mai, MD 10/22/13 2133

## 2013-10-22 NOTE — ED Notes (Signed)
Pt here from [facility] , pt has a syncopal episode at the office , per staff pt turned grey, very diaphoretic and heart rate in the 50's . Pt now alert and oriented, SR , no complaints at this time , pt does have a history of syncopal episodes

## 2013-10-22 NOTE — ED Provider Notes (Signed)
CSN: [id number]     Arrival date & time 10/22/13  1825 History   First MD Initiated Contact with Patient 10/22/13 1945     Chief Complaint  Patient presents with  . Loss of Consciousness     (Consider location/radiation/quality/duration/timing/severity/associated sxs/prior Treatment) HPI  Patient to the ER by EMS after a vasovagal incident while at the Nurse Practiioners office. The patient says that ever since he was 43 years old he has been having vagal incidents with strong emotions or when blood is drawn. At the office today he was laying supine when he had syncope. The FNP reports him turing grey, cold and clammy and being unable to feel a pulse. The patient reports not being entirely sure why he is here. He has not been having any new symptoms but feels as though maybe his episodes are happening more frequently. He denies having any focal weakness, confusion, dysphagia, aphagia, n/v/d, back pain, abdominal pains.  Past Medical History  Diagnosis Date  . Hypertension    Past Surgical History  Procedure Laterality Date  . Salivary gland surgery Left   . Cholecystectomy    . Foot surgery Left 2002   Family History  Problem Relation Age of Onset  . Diabetes Mother   . Heart disease Maternal Grandmother   . Hyperlipidemia Maternal Grandmother    History  Substance Use Topics  . Smoking status: Never Smoker   . Smokeless tobacco: Not on file  . Alcohol Use: No    Review of Systems  The patient denies anorexia, fever, weight loss,, vision loss, decreased hearing, hoarseness, chest pain, syncope, dyspnea on exertion, peripheral edema, balance deficits, hemoptysis, abdominal pain, melena, hematochezia, severe indigestion/heartburn, hematuria, incontinence, genital sores, muscle weakness, suspicious skin lesions, transient blindness, difficulty walking, depression, unusual weight change, abnormal bleeding, enlarged lymph nodes, angioedema, and breast masses.   Allergies   Fexofenadine hcl  Home Medications   Current Outpatient Rx  Name  Route  Sig  Dispense  Refill  . cholestyramine light (PREVALITE) 4 G packet   Oral   Take 1 packet (4 g total) by mouth 2 (two) times daily.   60 packet   11   . lisinopril-hydrochlorothiazide (PRINZIDE,ZESTORETIC) 20-25 MG per tablet   Oral   Take 1 tablet by mouth daily.   30 tablet   11   . sertraline (ZOLOFT) 50 MG tablet   Oral   Take 1 tablet (50 mg total) by mouth daily.   30 tablet   11    BP 115/67  Pulse 62  Temp(Src) 98.4 F (36.9 C) (Oral)  Resp 18  Ht 5\' 9"  (1.753 m)  Wt 263 lb (119.296 kg)  BMI 38.82 kg/m2  SpO2 98% Physical Exam  Nursing note and vitals reviewed. Constitutional: He is oriented to person, place, and time. He appears well-developed and well-nourished. No distress.  HENT:  Head: Normocephalic and atraumatic.  Eyes: Pupils are equal, round, and reactive to light.  Neck: Normal range of motion. Neck supple.  Cardiovascular: Normal rate and regular rhythm.   Pulmonary/Chest: Effort normal and breath sounds normal. He has no wheezes. He has no rales.  Abdominal: Soft. He exhibits no distension. There is no tenderness.  Neurological: He is alert and oriented to person, place, and time. He has normal strength. No cranial nerve deficit or sensory deficit. He displays a negative Romberg sign. GCS eye subscore is 4. GCS verbal subscore is 5. GCS motor subscore is 6.  Skin: Skin is warm and  dry.  Psychiatric: His mood appears anxious.    ED Course  Procedures (including critical care time) Labs Review Labs Reviewed  CBC WITH DIFFERENTIAL - Abnormal; Notable for the following:    WBC 12.1 (*)    MCHC 36.2 (*)    Neutrophils Relative % 81 (*)    Neutro Abs 9.8 (*)    All other components within normal limits  COMPREHENSIVE METABOLIC PANEL  Randolm Idol, ED   Imaging Review Dg Chest 2 View  10/22/2013   CLINICAL DATA:  Syncope  EXAM: CHEST  2 VIEW  COMPARISON:  None.   FINDINGS: The heart size and mediastinal contours are within normal limits. Both lungs are clear. The visualized skeletal structures are unremarkable.  IMPRESSION: No active cardiopulmonary disease.   Electronically Signed   By: Lajean Manes M.D.   On: 10/22/2013 20:44     EKG Interpretation   Date/Time:  Friday October 22 2013 18:35:43 EDT Ventricular Rate:  62 PR Interval:  197 QRS Duration: 101 QT Interval:  419 QTC Calculation: 425 R Axis:   4 Text Interpretation:  Sinus rhythm Poor data quality wandering baseline in  II, III, aVF Confirmed by [facility]  MD, MICHEAL ([id number]) on 10/22/2013 7:08:22  PM      MDM   Final diagnoses:  Vasovagal episode    The patient has been having these episodes since he was 43. He denies anything being different about this episode. He went to the family practice provider today because he woke up not feeling well. While there they decided to draw labs which elicited  a syncopal episode.   While here, i ordered blood work. Nurse reports as soon as she got the needle close he had another near syncopal episode. He reports getting "fight or flight" with blood draws, his BP dropped but recovered with focused breathing. He labs, chest xray and EKG are reassuring. I discussed case with Dr. Dorna Mai who is comfortable with me going home. Will give pt referral to Neurology so he can follow-up.  43 y.o.Todd Peters's evaluation in the Emergency Department is complete. It has been determined that no acute conditions requiring further emergency intervention are present at this time. The patient/guardian have been advised of the diagnosis and plan. We have discussed signs and symptoms that warrant return to the ED, such as changes or worsening in symptoms.  Vital signs are stable at discharge. Filed Vitals:   10/22/13 2114  BP: 115/67  Pulse: 62  Temp: 98.4 F (36.9 C)  Resp: 18    Patient/guardian has voiced understanding and agreed to follow-up with the PCP or  specialist.     Linus Mako, PA-C 10/22/13 2121

## 2013-10-22 NOTE — ED Notes (Signed)
During venipuncture for lab pt. Was given 4L oxygen due to hx of passing out during blood draw.

## 2013-10-22 NOTE — ED Notes (Signed)
Pt did say that the syncopal episode happened during the blood draw

## 2013-10-23 LAB — CBC WITH DIFFERENTIAL/PLATELET
Basophils Absolute: 0.1 10*3/uL (ref 0.0–0.2)
Basos: 1 %
Eos: 3 %
Eosinophils Absolute: 0.3 10*3/uL (ref 0.0–0.4)
HCT: 46.5 % (ref 37.5–51.0)
Hemoglobin: 16.8 g/dL (ref 12.6–17.7)
Immature Grans (Abs): 0 10*3/uL (ref 0.0–0.1)
Immature Granulocytes: 0 %
Lymphocytes Absolute: 2.3 10*3/uL (ref 0.7–3.1)
Lymphs: 21 %
MCH: 29.4 pg (ref 26.6–33.0)
MCHC: 36.1 g/dL — ABNORMAL HIGH (ref 31.5–35.7)
MCV: 81 fL (ref 79–97)
Monocytes Absolute: 0.8 10*3/uL (ref 0.1–0.9)
Monocytes: 8 %
Neutrophils Absolute: 7.1 10*3/uL — ABNORMAL HIGH (ref 1.4–7.0)
Neutrophils Relative %: 67 %
RBC: 5.71 x10E6/uL (ref 4.14–5.80)
RDW: 13.7 % (ref 12.3–15.4)
WBC: 10.5 10*3/uL (ref 3.4–10.8)

## 2013-10-23 LAB — CMP14+EGFR
ALT: 34 IU/L (ref 0–44)
AST: 21 IU/L (ref 0–40)
Albumin/Globulin Ratio: 1.8 (ref 1.1–2.5)
Albumin: 4.5 g/dL (ref 3.5–5.5)
Alkaline Phosphatase: 120 IU/L — ABNORMAL HIGH (ref 39–117)
BUN/Creatinine Ratio: 11 (ref 9–20)
BUN: 10 mg/dL (ref 6–24)
CO2: 23 mmol/L (ref 18–29)
Calcium: 9.4 mg/dL (ref 8.7–10.2)
Chloride: 101 mmol/L (ref 97–108)
Creatinine, Ser: 0.95 mg/dL (ref 0.76–1.27)
GFR calc Af Amer: 114 mL/min/{1.73_m2} (ref >59)
GFR calc non Af Amer: 99 mL/min/{1.73_m2} (ref >59)
Globulin, Total: 2.5 g/dL (ref 1.5–4.5)
Glucose: 94 mg/dL (ref 65–99)
Potassium: 3.9 mmol/L (ref 3.5–5.2)
Sodium: 141 mmol/L (ref 134–144)
Total Bilirubin: 0.5 mg/dL (ref 0.0–1.2)
Total Protein: 7 g/dL (ref 6.0–8.5)

## 2013-10-23 LAB — THYROID PANEL WITH TSH
Free Thyroxine Index: 2.5 (ref 1.2–4.9)
T3 Uptake Ratio: 29 % (ref 24–39)
T4, Total: 8.7 ug/dL (ref 4.5–12.0)
TSH: 1.32 u[IU]/mL (ref 0.450–4.500)

## 2013-10-23 LAB — PSA, TOTAL AND FREE
PSA, Free Pct: 20 %
PSA, Free: 0.34 ng/mL
PSA: 1.7 ng/mL (ref 0.0–4.0)

## 2013-11-09 ENCOUNTER — Encounter: Payer: Self-pay | Admitting: Cardiology

## 2013-11-23 ENCOUNTER — Institutional Professional Consult (permissible substitution): Payer: Managed Care, Other (non HMO) | Admitting: Cardiology

## 2013-12-01 ENCOUNTER — Institutional Professional Consult (permissible substitution): Payer: Managed Care, Other (non HMO) | Admitting: Cardiology

## 2013-12-02 ENCOUNTER — Encounter: Payer: Self-pay | Admitting: *Deleted

## 2013-12-07 ENCOUNTER — Ambulatory Visit (INDEPENDENT_AMBULATORY_CARE_PROVIDER_SITE_OTHER): Payer: Managed Care, Other (non HMO) | Admitting: Cardiology

## 2013-12-07 ENCOUNTER — Encounter: Payer: Self-pay | Admitting: Cardiology

## 2013-12-07 VITALS — BP 126/83 | HR 71 | Ht 69.0 in | Wt 262.0 lb

## 2013-12-07 DIAGNOSIS — G909 Disorder of the autonomic nervous system, unspecified: Secondary | ICD-10-CM | POA: Insufficient documentation

## 2013-12-07 DIAGNOSIS — I10 Essential (primary) hypertension: Secondary | ICD-10-CM | POA: Insufficient documentation

## 2013-12-07 DIAGNOSIS — R55 Syncope and collapse: Secondary | ICD-10-CM

## 2013-12-07 MED ORDER — FLUDROCORTISONE ACETATE 0.1 MG PO TABS: 0.1000 mg | ORAL_TABLET | Freq: Every day | ORAL | Status: DC

## 2013-12-07 MED ORDER — METOPROLOL TARTRATE 25 MG PO TABS: 25.0000 mg | ORAL_TABLET | Freq: Two times a day (BID) | ORAL | Status: DC

## 2013-12-07 NOTE — Patient Instructions (Addendum)
START TAKING METOPROLOL 25 MG TWICE DAILY  START TAKING FLORINEF 0.1 MG DAILY   Your physician has requested that you have an echocardiogram. Echocardiography is a painless test that uses sound waves to create images of your heart. It provides your doctor with information about the size and shape of your heart and how well your heart's chambers and valves are working. This procedure takes approximately one hour. There are no restrictions for this procedure.  Your physician has recommended that you wear a 48 hour holter monitor. Holter monitors are medical devices that record the heart's electrical activity. Doctors most often use these monitors to diagnose arrhythmias. Arrhythmias are problems with the speed or rhythm of the heartbeat. The monitor is a small, portable device. You can wear one while you do your normal daily activities. This is usually used to diagnose what is causing palpitations/syncope (passing out).   FOLLOW UP PENDING TEST RESULTS

## 2013-12-07 NOTE — Progress Notes (Signed)
Patient ID: Todd Peters, male   DOB: 1971/01/24, 43 y.o.   MRN: [id number]     Patient Name: Todd Peters Date of Encounter: 12/07/2013  Primary Care Provider:  Redge Gainer, MD Primary Cardiologist:  Dorothy Spark  Problem List   Past Medical History  Diagnosis Date  . Hypertension   . Acid reflux    Past Surgical History  Procedure Laterality Date  . Salivary gland surgery Left   . Cholecystectomy    . Foot surgery Left 2002    Allergies  Allergies  Allergen Reactions  . Fexofenadine Hcl Other (See Comments)    Racing heart    HPI Pleasant 43 year old gentleman with prior medical history of recurrent syncopal episodes. He states that he experienced it first died at a young age when he was sick. It continued throughout his life especially in his situation like venepuncture or other stressful situations. Prolonged standing also can trig or syncopal episodes. He is mostly concerned because in the last few years he feels presyncopal almost every day and has experienced several syncopal episodes in the situations like laughing, straining at the toilet, after large meals. He was now referred after he passed out at the doctor's office while they were drawing blood. The patient has a past medical history of hypertension treated with lisinopril Hydrochlorothiazide combination. He has never smoked, denies any chest pain, palpitations prior to these episodes. He doesn't exercise. He is obese. He has a family history of coronary artery disease and is grandmother of unknown age. He is passing died in his 72s of sudden cardiac death.   Home Medications  Prior to Admission medications   Medication Sig Start Date End Date Taking? Authorizing Provider  lisinopril-hydrochlorothiazide (PRINZIDE,ZESTORETIC) 20-25 MG per tablet Take 1 tablet by mouth daily. 10/22/13   Lysbeth Penner, FNP  sertraline (ZOLOFT) 50 MG tablet Take 1 tablet (50 mg total) by mouth daily. 10/22/13   Lysbeth Penner, FNP    Family History  Family History  Problem Relation Age of Onset  . Diabetes Mother   . Heart disease Maternal Grandmother   . Hyperlipidemia Maternal Grandmother   . Heart attack Maternal Grandmother     Social History  History   Social History  . Marital Status: Married    Spouse Name: N/A    Number of Children: N/A  . Years of Education: N/A   Occupational History  . Not on file.   Social History Main Topics  . Smoking status: Never Smoker   . Smokeless tobacco: Not on file  . Alcohol Use: No  . Drug Use: No  . Sexual Activity: Not on file   Other Topics Concern  . Not on file   Social History Narrative  . No narrative on file     Review of Systems, as per HPI, otherwise negative General:  No chills, fever, night sweats or weight changes.  Cardiovascular:  No chest pain, dyspnea on exertion, edema, orthopnea, palpitations, paroxysmal nocturnal dyspnea. Dermatological: No rash, lesions/masses Respiratory: No cough, dyspnea Urologic: No hematuria, dysuria Abdominal:   No nausea, vomiting, diarrhea, bright red blood per rectum, melena, or hematemesis Neurologic:  No visual changes, wkns, changes in mental status. All other systems reviewed and are otherwise negative except as noted above.  Physical Exam  Blood pressure 118/72, pulse 80, height 5\' 9"  (1.753 m), weight 262 lb (118.842 kg).  General: Pleasant, NAD Psych: Normal affect. Neuro: Alert and oriented X 3. Moves all extremities spontaneously. HEENT:  Normal  Neck: Supple without bruits or JVD. Lungs:  Resp regular and unlabored, CTA. Heart: RRR no s3, s4, or murmurs. Abdomen: Soft, non-tender, non-distended, BS + x 4.  Extremities: No clubbing, cyanosis or edema. DP/PT/Radials 2+ and equal bilaterally.  Labs:  No results found for this basename: CKTOTAL, CKMB, TROPONINI,  in the last 72 hours Lab Results  Component Value Date   WBC 12.1* 10/22/2013   HGB 15.7 10/22/2013   HCT  43.4 10/22/2013   MCV 82.0 10/22/2013   PLT 156 10/22/2013    No results found for this basename: DDIMER   No components found with this basename: POCBNP,     Component Value Date/Time   NA 141 10/22/2013 1944   NA 141 10/22/2013 1707   K 4.0 10/22/2013 1944   CL 103 10/22/2013 1944   CO2 26 10/22/2013 1944   GLUCOSE 95 10/22/2013 1944   GLUCOSE 94 10/22/2013 1707   BUN 12 10/22/2013 1944   BUN 10 10/22/2013 1707   CREATININE 0.95 10/22/2013 1944   CALCIUM 9.2 10/22/2013 1944   PROT 6.8 10/22/2013 1944   PROT 7.0 10/22/2013 1707   ALBUMIN 3.6 10/22/2013 1944   AST 24 10/22/2013 1944   ALT 32 10/22/2013 1944   ALKPHOS 111 10/22/2013 1944   BILITOT 0.5 10/22/2013 1944   GFRNONAA >90 10/22/2013 1944   GFRAA >90 10/22/2013 1944   No results found for this basename: CHOL, HDL, LDLCALC, TRIG    Accessory Clinical Findings  echocardiogram  ECG - SR, normal ECG    Assessment & Plan  A pleasant 43 year old gentleman with autonomic dysfunction and with current syncopes but seem like a typical neurocardiogenic syncopes also known as vasovagal syncope or situational syncope. These syncopes and had been in a typical situation like throwing up a lot prolonged standing or situations where his legal pole these increased like straining or coughing. However her behalf to rule out structural heart disease, therefore we will schedule an echocardiogram and 48 hour Holter monitoring. His baseline EKG is normal. Prostatic checked in our office to this were negative. We will stop vasodilator and diuretic and start him on metoprolol 25 mg twice a day and Fiorinef 0.1 mg daily. He is advised to avoid situations that will lead to syncope and stay hydrated.  Followup in one month.  Dorothy Spark, MD, [facility] 12/07/2013, 8:49 AM

## 2013-12-08 ENCOUNTER — Ambulatory Visit (INDEPENDENT_AMBULATORY_CARE_PROVIDER_SITE_OTHER): Payer: Managed Care, Other (non HMO) | Admitting: Radiology

## 2013-12-08 ENCOUNTER — Encounter: Payer: Self-pay | Admitting: *Deleted

## 2013-12-08 ENCOUNTER — Emergency Department (HOSPITAL_COMMUNITY): Payer: Managed Care, Other (non HMO)

## 2013-12-08 ENCOUNTER — Emergency Department (HOSPITAL_COMMUNITY)
Admission: EM | Admit: 2013-12-08 | Discharge: 2013-12-08 | Disposition: A | Discharge status: Home / Self Care | Payer: Managed Care, Other (non HMO) | Attending: Emergency Medicine | Admitting: Emergency Medicine

## 2013-12-08 ENCOUNTER — Encounter (HOSPITAL_COMMUNITY): Payer: Self-pay | Admitting: Emergency Medicine

## 2013-12-08 VITALS — BP 140/90 | HR 70 | Resp 18

## 2013-12-08 DIAGNOSIS — R059 Cough, unspecified: Secondary | ICD-10-CM | POA: Insufficient documentation

## 2013-12-08 DIAGNOSIS — IMO0002 Reserved for concepts with insufficient information to code with codable children: Secondary | ICD-10-CM | POA: Insufficient documentation

## 2013-12-08 DIAGNOSIS — R55 Syncope and collapse: Secondary | ICD-10-CM

## 2013-12-08 DIAGNOSIS — R079 Chest pain, unspecified: Secondary | ICD-10-CM

## 2013-12-08 DIAGNOSIS — R05 Cough: Secondary | ICD-10-CM | POA: Insufficient documentation

## 2013-12-08 DIAGNOSIS — I10 Essential (primary) hypertension: Secondary | ICD-10-CM | POA: Insufficient documentation

## 2013-12-08 DIAGNOSIS — Z79899 Other long term (current) drug therapy: Secondary | ICD-10-CM | POA: Insufficient documentation

## 2013-12-08 DIAGNOSIS — R12 Heartburn: Secondary | ICD-10-CM | POA: Insufficient documentation

## 2013-12-08 DIAGNOSIS — Z8719 Personal history of other diseases of the digestive system: Secondary | ICD-10-CM | POA: Insufficient documentation

## 2013-12-08 LAB — CBC
HCT: 43.2 % (ref 39.0–52.0)
Hemoglobin: 15.4 g/dL (ref 13.0–17.0)
MCH: 29.4 pg (ref 26.0–34.0)
MCHC: 35.6 g/dL (ref 30.0–36.0)
MCV: 82.6 fL (ref 78.0–100.0)
PLATELETS: 234 10*3/uL (ref 150–400)
RBC: 5.23 MIL/uL (ref 4.22–5.81)
RDW: 12.9 % (ref 11.5–15.5)
WBC: 8.3 10*3/uL (ref 4.0–10.5)

## 2013-12-08 LAB — BASIC METABOLIC PANEL
BUN: 13 mg/dL (ref 6–23)
CHLORIDE: 104 meq/L (ref 96–112)
CO2: 25 mEq/L (ref 19–32)
Calcium: 9.1 mg/dL (ref 8.4–10.5)
Creatinine, Ser: 0.88 mg/dL (ref 0.50–1.35)
GFR calc Af Amer: >90 mL/min (ref >90)
GFR calc non Af Amer: >90 mL/min (ref >90)
Glucose, Bld: 114 mg/dL — ABNORMAL HIGH (ref 70–99)
Potassium: 3.8 mEq/L (ref 3.7–5.3)
Sodium: 140 mEq/L (ref 137–147)

## 2013-12-08 LAB — I-STAT TROPONIN, ED
TROPONIN I, POC: 0.02 ng/mL (ref 0.00–0.08)
Troponin i, poc: 0 ng/mL (ref 0.00–0.08)

## 2013-12-08 MED ORDER — ASPIRIN EC 325 MG PO TBEC
325.0000 mg | DELAYED_RELEASE_TABLET | Freq: Once | ORAL | Status: AC
  Administered 2013-12-08: 325 mg via ORAL
  Filled 2013-12-08: qty 1

## 2013-12-08 MED ORDER — OMEPRAZOLE 20 MG PO CPDR: 20.0000 mg | DELAYED_RELEASE_CAPSULE | Freq: Every day | ORAL | Status: AC

## 2013-12-08 MED ORDER — GI COCKTAIL ~~LOC~~
30.0000 mL | Freq: Once | ORAL | Status: AC
  Administered 2013-12-08: 30 mL via ORAL
  Filled 2013-12-08: qty 30

## 2013-12-08 NOTE — ED Notes (Signed)
PT WEARING HOLTER MONITOR.

## 2013-12-08 NOTE — Progress Notes (Signed)
Pt had scheduled to come into our office for a 48 hour holter monitor.  Pt stated that when he arrived here he started experiencing active chest pain. Pt rates pain a 4 on a scale of 0-10.  Pain feels like pressure in the center of chest.  No radiating pain. Ekg performed and interpreted by Dr Meda Coffee.  Vital signs - 140/90 - 70 regular- resp-18 Pt does appear pale in color but not diaphoretic.  Pt denies any sob.  Per Dr Meda Coffee pt needs to get his 31 holter monitor applied now, and go to [facility] ED for further eval of active CP.  Informed pt that he will be going via EMS to [facility] ED for further eval, and pt REFUSED to be transported by EMS.  AMA form explained to pt and signed by the the pt with witnesses.  Trish the cardmaster at [facility] knows pt will be arriving via private vehicle with active CP.  Wife notified that pt will be driving via private vehicle to [facility] ED.

## 2013-12-08 NOTE — Discharge Instructions (Signed)
Chest Pain (Nonspecific) °It is often hard to give a specific diagnosis for the cause of chest pain. There is always a chance that your pain could be related to something serious, such as a heart attack or a blood clot in the lungs. You need to follow up with your caregiver for further evaluation. °CAUSES  °· Heartburn. °· Pneumonia or bronchitis. °· Anxiety or stress. °· Inflammation around your heart (pericarditis) or lung (pleuritis or pleurisy). °· A blood clot in the lung. °· A collapsed lung (pneumothorax). It can develop suddenly on its own (spontaneous pneumothorax) or from injury (trauma) to the chest. °· Shingles infection (herpes zoster virus). °The chest wall is composed of bones, muscles, and cartilage. Any of these can be the source of the pain. °· The bones can be bruised by injury. °· The muscles or cartilage can be strained by coughing or overwork. °· The cartilage can be affected by inflammation and become sore (costochondritis). °DIAGNOSIS  °Lab tests or other studies, such as X-rays, electrocardiography, stress testing, or cardiac imaging, may be needed to find the cause of your pain.  °TREATMENT  °· Treatment depends on what may be causing your chest pain. Treatment may include: °· Acid blockers for heartburn. °· Anti-inflammatory medicine. °· Pain medicine for inflammatory conditions. °· Antibiotics if an infection is present. °· You may be advised to change lifestyle habits. This includes stopping smoking and avoiding alcohol, caffeine, and chocolate. °· You may be advised to keep your head raised (elevated) when sleeping. This reduces the chance of acid going backward from your stomach into your esophagus. °· Most of the time, nonspecific chest pain will improve within 2 to 3 days with rest and mild pain medicine. °HOME CARE INSTRUCTIONS  °· If antibiotics were prescribed, take your antibiotics as directed. Finish them even if you start to feel better. °· For the next few days, avoid physical  activities that bring on chest pain. Continue physical activities as directed. °· Do not smoke. °· Avoid drinking alcohol. °· Only take over-the-counter or prescription medicine for pain, discomfort, or fever as directed by your caregiver. °· Follow your caregiver's suggestions for further testing if your chest pain does not go away. °· Keep any follow-up appointments you made. If you do not go to an appointment, you could develop lasting (chronic) problems with pain. If there is any problem keeping an appointment, you must call to reschedule. °SEEK MEDICAL CARE IF:  °· You think you are having problems from the medicine you are taking. Read your medicine instructions carefully. °· Your chest pain does not go away, even after treatment. °· You develop a rash with blisters on your chest. °SEEK IMMEDIATE MEDICAL CARE IF:  °· You have increased chest pain or pain that spreads to your arm, neck, jaw, back, or abdomen. °· You develop shortness of breath, an increasing cough, or you are coughing up blood. °· You have severe back or abdominal pain, feel nauseous, or vomit. °· You develop severe weakness, fainting, or chills. °· You have a fever. °THIS IS AN EMERGENCY. Do not wait to see if the pain will go away. Get medical help at once. Call your local emergency services (911 in [place] Do not drive yourself to the hospital. °MAKE SURE YOU:  °· Understand these instructions. °· Will watch your condition. °· Will get help right away if you are not doing well or get worse. °Document Released: 04/24/2005 Document Revised: 10/07/2011 Document Reviewed: 02/18/2008 °ExitCare® Patient Information ©2014 [employer]   [employer]. ° °

## 2013-12-08 NOTE — Patient Instructions (Signed)
PLEASE GO TO [place] FOR FURTHER EVALUATION FOR ACTIVE CHEST PAIN PER DR NELSON  PLEASE CONTINUE TO WEAR YOUR 38 HOUR HOLTER MONITOR PER DR [place]  Your physician recommends that you continue on your current medications as directed. Please refer to the Current Medication list given to you today.

## 2013-12-08 NOTE — ED Provider Notes (Signed)
CSN: [id number]     Arrival date & time 12/08/13  1721 History   First MD Initiated Contact with Patient 12/08/13 1856     Chief Complaint  Patient presents with  . Chest Pain     (Consider location/radiation/quality/duration/timing/severity/associated sxs/prior Treatment) Patient is a 43 y.o. male presenting with chest pain. The history is provided by the patient.  Chest Pain Pain location:  L chest Pain quality: pressure   Pain radiates to:  Does not radiate Pain radiates to the back: no   Pain severity:  Moderate Onset quality:  Gradual Duration:  1 day Timing:  Constant Progression:  Unchanged Chronicity:  New Relieved by:  Nothing Worsened by:  Nothing tried Ineffective treatments:  None tried Associated symptoms: cough (after eating), heartburn and syncope (None today but multiple lifetime events be worked up on an outpatient basis with cardio)   Associated symptoms: no diaphoresis, no dizziness, no fever, no numbness, no palpitations, no shortness of breath, not vomiting and no weakness     Past Medical History  Diagnosis Date  . Hypertension   . Acid reflux    Past Surgical History  Procedure Laterality Date  . Salivary gland surgery Left   . Cholecystectomy    . Foot surgery Left 2002   Family History  Problem Relation Age of Onset  . Diabetes Mother   . Heart disease Maternal Grandmother   . Hyperlipidemia Maternal Grandmother   . Heart attack Maternal Grandmother    History  Substance Use Topics  . Smoking status: Never Smoker   . Smokeless tobacco: Not on file  . Alcohol Use: No    Review of Systems  Constitutional: Negative for fever and diaphoresis.  Respiratory: Positive for cough (after eating). Negative for shortness of breath.   Cardiovascular: Positive for chest pain and syncope (None today but multiple lifetime events be worked up on an outpatient basis with cardio). Negative for palpitations.  Gastrointestinal: Positive for heartburn.  Negative for vomiting.  Neurological: Negative for dizziness, weakness and numbness.  All other systems reviewed and are negative.     Allergies  Fexofenadine hcl  Home Medications   Prior to Admission medications   Medication Sig Start Date End Date Taking? Authorizing Provider  fludrocortisone (FLORINEF) 0.1 MG tablet Take 1 tablet (0.1 mg total) by mouth daily. 12/07/13  Yes Dorothy Spark, MD  lisinopril-hydrochlorothiazide (PRINZIDE,ZESTORETIC) 20-25 MG per tablet Take 1 tablet by mouth daily. 10/22/13  Yes Lysbeth Penner, FNP  metoprolol tartrate (LOPRESSOR) 25 MG tablet Take 1 tablet (25 mg total) by mouth 2 (two) times daily. 12/07/13  Yes Dorothy Spark, MD  sertraline (ZOLOFT) 50 MG tablet Take 1 tablet (50 mg total) by mouth daily. 10/22/13  Yes Orson Ape [place], FNP   BP 146/95  Pulse 66  Temp(Src) 98.3 F (36.8 C) (Oral)  Resp 16  Ht 5\' 10"  (1.778 m)  Wt 260 lb (117.935 kg)  BMI 37.31 kg/m2  SpO2 98% Physical Exam  Constitutional: He is oriented to person, place, and time. He appears well-developed and well-nourished. No distress.  HENT:  Head: Normocephalic and atraumatic.  Eyes: Conjunctivae are normal.  Neck: Neck supple. No tracheal deviation present.  Cardiovascular: Normal rate and regular rhythm.   No murmur heard. Pulmonary/Chest: Effort normal. No respiratory distress. He has no wheezes. He exhibits no tenderness.  Abdominal: Soft. He exhibits no distension. There is no tenderness.  Neurological: He is alert and oriented to person, place, and time.  Skin:  Skin is warm and dry.  Psychiatric: He has a normal mood and affect.    ED Course  Procedures (including critical care time) Labs Review Labs Reviewed  BASIC METABOLIC PANEL - Abnormal; Notable for the following:    Glucose, Bld 114 (*)    All other components within normal limits  CBC  I-STAT TROPOININ, ED  Randolm Idol, ED    Imaging Review Dg Chest 2 View  12/08/2013    CLINICAL DATA:  Left upper chest pain  EXAM: CHEST  2 VIEW  COMPARISON:  10/22/2013  FINDINGS: Cardiomediastinal silhouette is stable. No acute infiltrate or pleural effusion. No pulmonary edema. Bony thorax is unremarkable.  IMPRESSION: No active cardiopulmonary disease.   Electronically Signed   By: Lahoma Crocker M.D.   On: 12/08/2013 19:41     EKG Interpretation None      MDM   Final diagnoses:  Chest pain at rest   43 y.o. male presents with left-sided chest pain that started this morning while he was on the way to the cardiologist's office to get set up for Holter monitoring. He's been seen by them for recurrent syncopal episodes been occurring for most of his life. He has had no prior cardiac disease. He does have hypertension. He has been doing with recurrent acid reflux symptoms and a cough over the last several weeks he believes may be related. He is not on any PPI. Benign exam, normal chest x-ray, first troponin is negative. EKG without ST or T wave changes concerning for myocardial ischemia. No delta wave, no prolonged QTc, no brugada to suggest arrhythmogenicity. Patient was given GI cocktail for possible esophageal etiology and an aspirin.  Plan will be to order a second troponin to ensure no myocardial ischemia. Patient's symptoms are fairly atypical and troponins are negative, but concerning enough for outpatient stress testing, so cardiology was consulted to set up this followup with Dr. Francesca Oman office and to receive a stress test at a later date.     Leo Grosser, MD 12/09/13 [phone]

## 2013-12-08 NOTE — ED Notes (Signed)
Pt reports on the way to the Cardiologist today, he began to have left sided chest pain. Pt had heart monitor placed in MD's office due to episodes of passing out over the last several years. Pt describes pain as heaviness. Lightheadedness today.

## 2013-12-08 NOTE — Progress Notes (Signed)
Patient ID: Todd Peters, male   DOB: 1970-11-05, 43 y.o.   MRN: [id number] EVO 48 hour holter monitor applied to patient.

## 2013-12-09 NOTE — ED Provider Notes (Signed)
I saw and evaluated the patient, reviewed the resident's note and I agree with the findings and plan.   .Face to face Exam:  General:  Awake HEENT:  Atraumatic Resp:  Normal effort Abd:  Nondistended Neuro:No focal weakness  I reviewed EKG and agree with results and findings.  Dot Lanes, MD 12/09/13 [phone]

## 2013-12-14 ENCOUNTER — Telehealth: Payer: Self-pay | Admitting: *Deleted

## 2013-12-14 NOTE — Telephone Encounter (Signed)
Contacted pt about normal holter monitor results per Dr Meda Coffee.  Pt verbalized understanding and pleased with this news.

## 2013-12-22 ENCOUNTER — Other Ambulatory Visit (HOSPITAL_COMMUNITY): Payer: Managed Care, Other (non HMO)

## 2013-12-23 ENCOUNTER — Encounter (HOSPITAL_COMMUNITY): Payer: Self-pay | Admitting: Cardiology

## 2013-12-31 ENCOUNTER — Encounter: Payer: Self-pay | Admitting: *Deleted

## 2013-12-31 ENCOUNTER — Encounter: Payer: Managed Care, Other (non HMO) | Admitting: Cardiology

## 2014-01-06 ENCOUNTER — Encounter: Payer: Self-pay | Admitting: Cardiology

## 2014-06-06 ENCOUNTER — Other Ambulatory Visit: Payer: Self-pay | Admitting: Cardiology

## 2014-07-16 ENCOUNTER — Other Ambulatory Visit: Payer: Self-pay | Admitting: Cardiology

## 2014-11-09 ENCOUNTER — Other Ambulatory Visit: Payer: Self-pay | Admitting: Family Medicine

## 2014-12-03 IMAGING — CR DG CHEST 2V
2 series · 2 of 2 positions shown · non-contrast
Comparison: 10/22/2013

CLINICAL DATA: Left upper chest pain

EXAM:
CHEST  2 VIEW

[w chest pa]
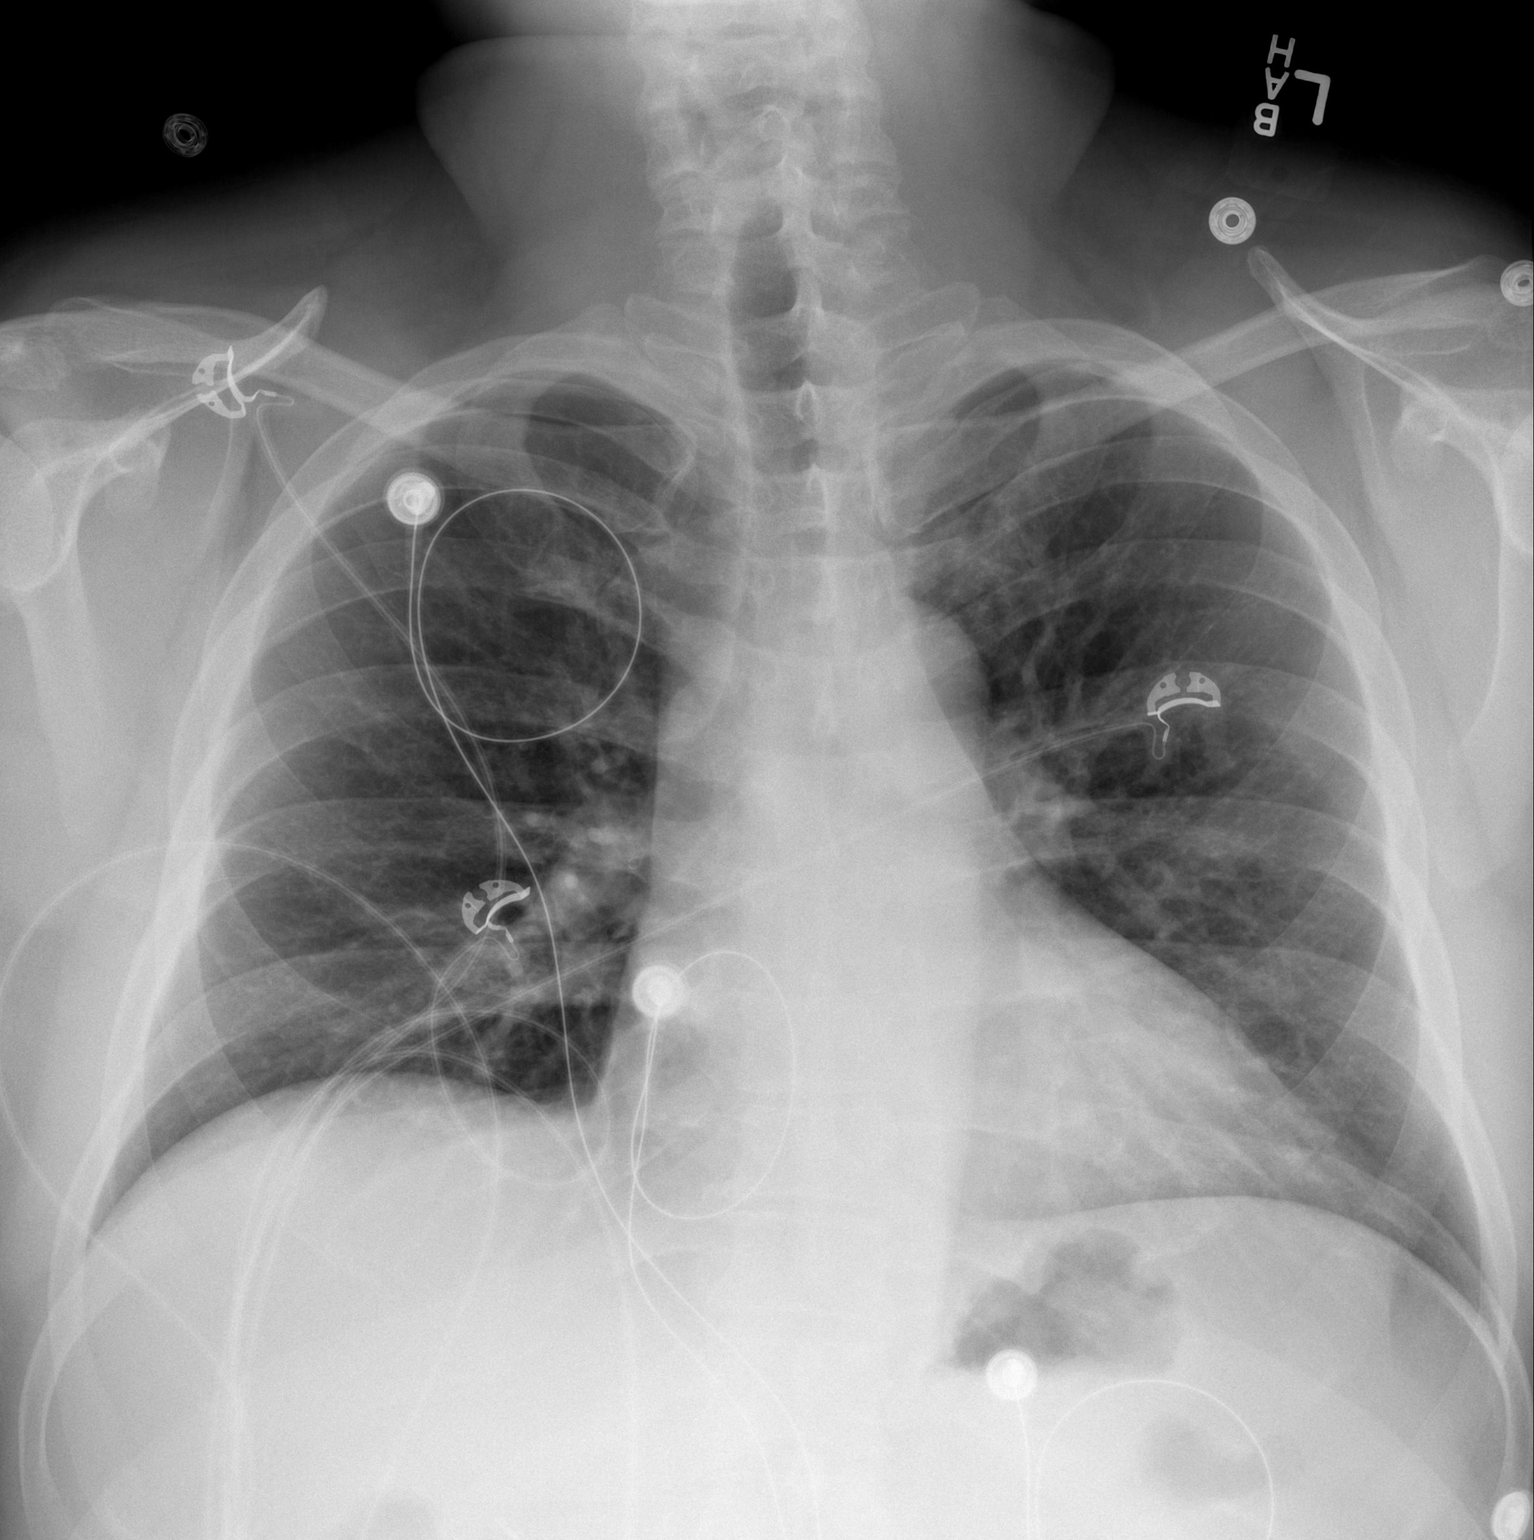

[w chest lat]
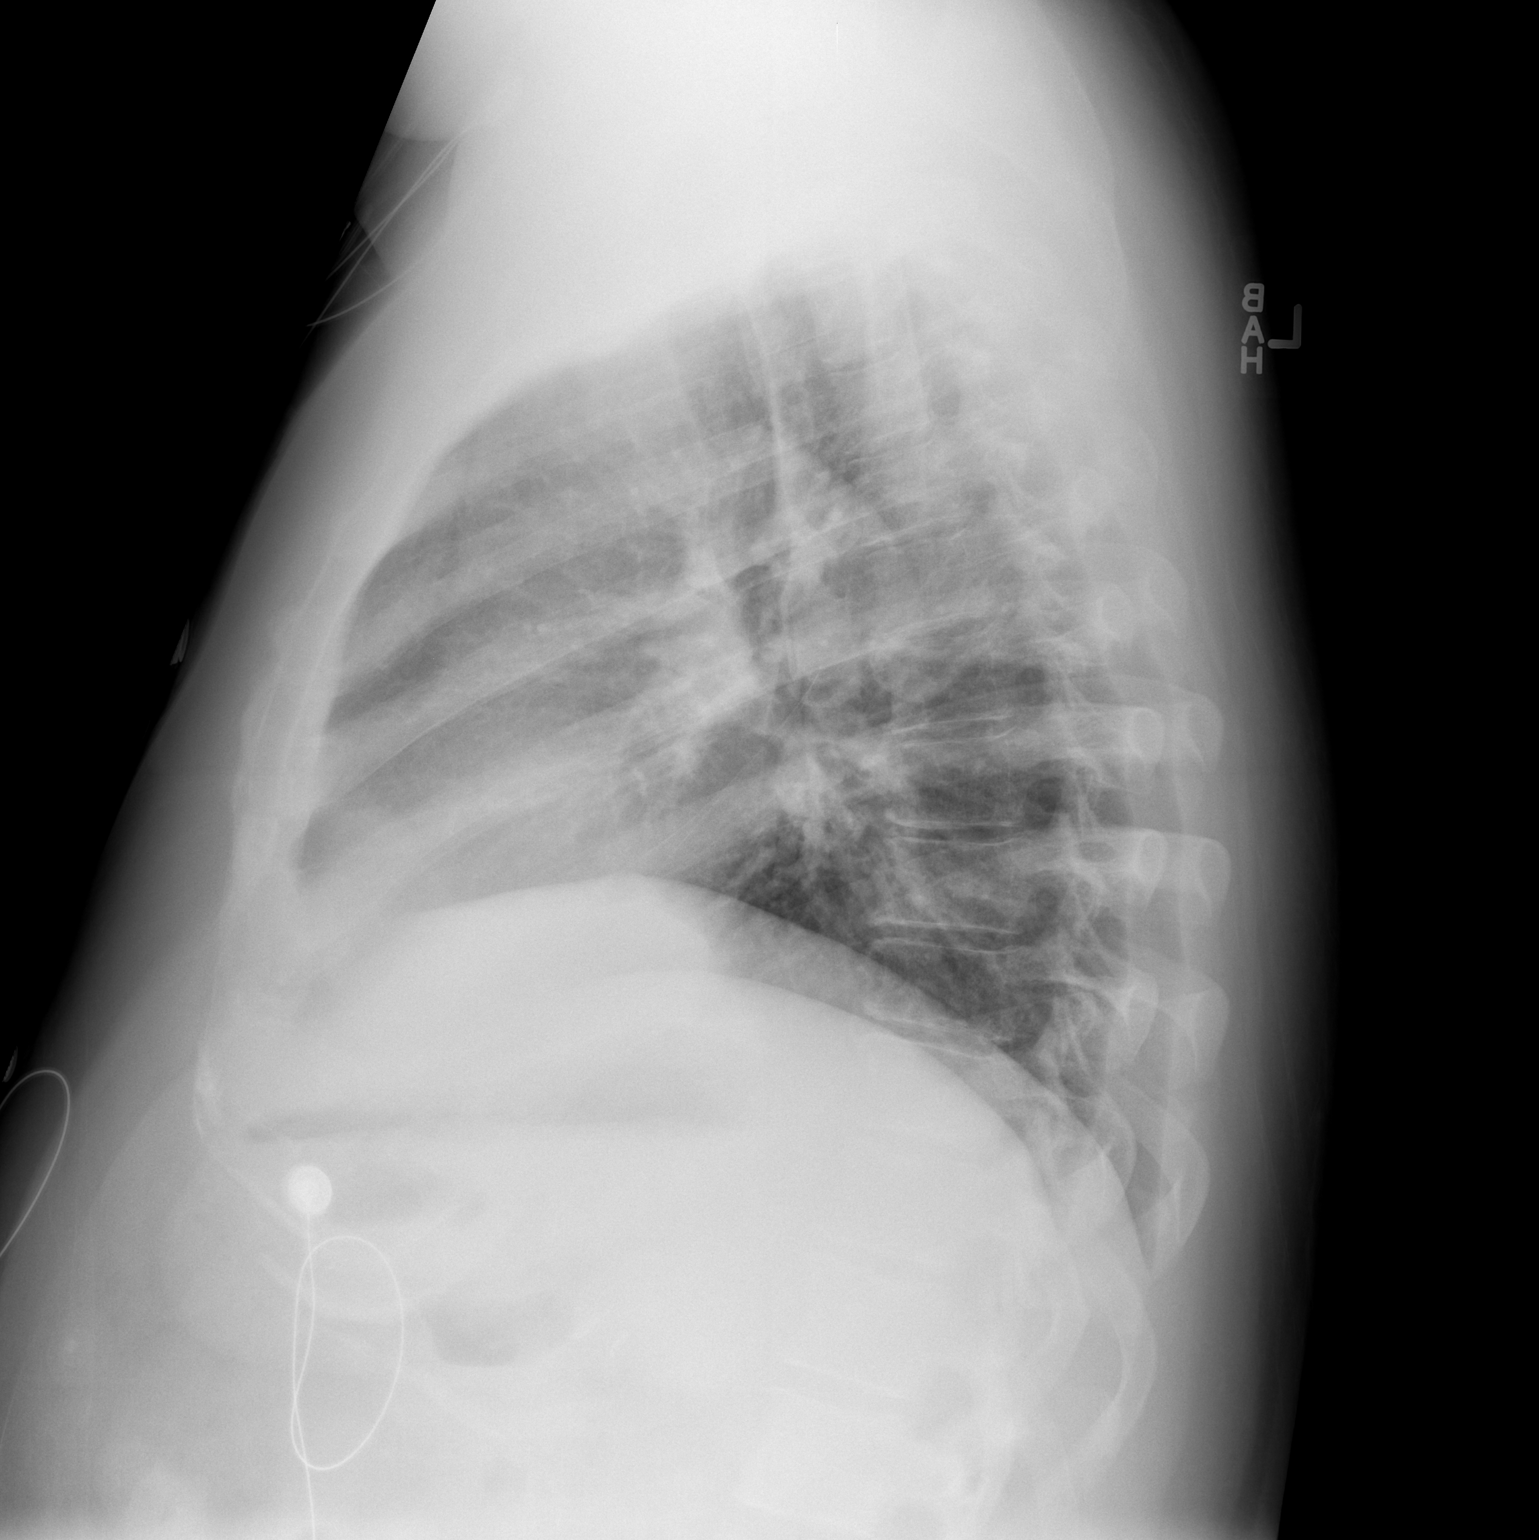

[2 of 2 positions shown; findings below may reference images not displayed]

FINDINGS: Cardiomediastinal silhouette is stable. No acute infiltrate or
pleural effusion. No pulmonary edema. Bony thorax is unremarkable.
IMPRESSION: No active cardiopulmonary disease.

## 2015-01-29 ENCOUNTER — Other Ambulatory Visit: Payer: Self-pay | Admitting: Cardiology

## 2015-04-09 ENCOUNTER — Other Ambulatory Visit: Payer: Self-pay | Admitting: Cardiology

## 2015-06-10 ENCOUNTER — Other Ambulatory Visit: Payer: Self-pay | Admitting: Cardiology

## 2015-11-12 ENCOUNTER — Other Ambulatory Visit: Payer: Self-pay | Admitting: Cardiology

## 2015-11-14 NOTE — Telephone Encounter (Signed)
Ok to refill
